# Patient Record
Sex: Male | Born: 1960 | Race: Black or African American | Hispanic: No | Marital: Single | Smoking: Current every day smoker
Health system: Southern US, Community
[De-identification: ages and names within clinical notes are randomized; demographics above are authoritative.]

## PROBLEM LIST (undated history)

## (undated) DIAGNOSIS — E785 Hyperlipidemia, unspecified: Secondary | ICD-10-CM

## (undated) DIAGNOSIS — I639 Cerebral infarction, unspecified: Secondary | ICD-10-CM

## (undated) DIAGNOSIS — I10 Essential (primary) hypertension: Secondary | ICD-10-CM

## (undated) DIAGNOSIS — E119 Type 2 diabetes mellitus without complications: Secondary | ICD-10-CM

## (undated) HISTORY — PX: TOTAL HIP ARTHROPLASTY: SHX124

---

## 2018-03-17 ENCOUNTER — Emergency Department (HOSPITAL_BASED_OUTPATIENT_CLINIC_OR_DEPARTMENT_OTHER): Payer: Medicaid Other

## 2018-03-17 ENCOUNTER — Other Ambulatory Visit: Payer: Self-pay

## 2018-03-17 ENCOUNTER — Encounter (HOSPITAL_BASED_OUTPATIENT_CLINIC_OR_DEPARTMENT_OTHER): Payer: Self-pay | Admitting: *Deleted

## 2018-03-17 ENCOUNTER — Emergency Department (HOSPITAL_BASED_OUTPATIENT_CLINIC_OR_DEPARTMENT_OTHER)
Admission: EM | Admit: 2018-03-17 | Discharge: 2018-03-17 | Disposition: A | Discharge status: Home / Self Care | Payer: Medicaid Other | Attending: Emergency Medicine | Admitting: Emergency Medicine

## 2018-03-17 DIAGNOSIS — Z7984 Long term (current) use of oral hypoglycemic drugs: Secondary | ICD-10-CM | POA: Diagnosis not present

## 2018-03-17 DIAGNOSIS — I10 Essential (primary) hypertension: Secondary | ICD-10-CM | POA: Diagnosis not present

## 2018-03-17 DIAGNOSIS — E119 Type 2 diabetes mellitus without complications: Secondary | ICD-10-CM | POA: Diagnosis not present

## 2018-03-17 DIAGNOSIS — Z79899 Other long term (current) drug therapy: Secondary | ICD-10-CM | POA: Diagnosis not present

## 2018-03-17 DIAGNOSIS — W06XXXA Fall from bed, initial encounter: Secondary | ICD-10-CM | POA: Diagnosis not present

## 2018-03-17 DIAGNOSIS — R911 Solitary pulmonary nodule: Secondary | ICD-10-CM | POA: Diagnosis not present

## 2018-03-17 DIAGNOSIS — Z8673 Personal history of transient ischemic attack (TIA), and cerebral infarction without residual deficits: Secondary | ICD-10-CM | POA: Diagnosis not present

## 2018-03-17 DIAGNOSIS — F17210 Nicotine dependence, cigarettes, uncomplicated: Secondary | ICD-10-CM | POA: Insufficient documentation

## 2018-03-17 DIAGNOSIS — R319 Hematuria, unspecified: Secondary | ICD-10-CM | POA: Diagnosis not present

## 2018-03-17 DIAGNOSIS — R0789 Other chest pain: Secondary | ICD-10-CM | POA: Diagnosis present

## 2018-03-17 DIAGNOSIS — E871 Hypo-osmolality and hyponatremia: Secondary | ICD-10-CM | POA: Diagnosis not present

## 2018-03-17 DIAGNOSIS — Z96649 Presence of unspecified artificial hip joint: Secondary | ICD-10-CM | POA: Diagnosis not present

## 2018-03-17 DIAGNOSIS — W19XXXA Unspecified fall, initial encounter: Secondary | ICD-10-CM

## 2018-03-17 HISTORY — DX: Cerebral infarction, unspecified: I63.9

## 2018-03-17 HISTORY — DX: Type 2 diabetes mellitus without complications: E11.9

## 2018-03-17 HISTORY — DX: Hyperlipidemia, unspecified: E78.5

## 2018-03-17 HISTORY — DX: Essential (primary) hypertension: I10

## 2018-03-17 LAB — CBC
HCT: 43.5 % (ref 39.0–52.0)
Hemoglobin: 14.2 g/dL (ref 13.0–17.0)
MCH: 30.5 pg (ref 26.0–34.0)
MCHC: 32.6 g/dL (ref 30.0–36.0)
MCV: 93.5 fL (ref 80.0–100.0)
Platelets: 255 10*3/uL (ref 150–400)
RBC: 4.65 MIL/uL (ref 4.22–5.81)
RDW: 12.7 % (ref 11.5–15.5)
WBC: 13.8 10*3/uL — ABNORMAL HIGH (ref 4.0–10.5)
nRBC: 0 % (ref 0.0–0.2)

## 2018-03-17 LAB — BASIC METABOLIC PANEL
ANION GAP: 9 (ref 5–15)
BUN: 20 mg/dL (ref 6–20)
CO2: 19 mmol/L — ABNORMAL LOW (ref 22–32)
Calcium: 8.7 mg/dL — ABNORMAL LOW (ref 8.9–10.3)
Chloride: 100 mmol/L (ref 98–111)
Creatinine, Ser: 1.1 mg/dL (ref 0.61–1.24)
GFR calc Af Amer: >60 mL/min (ref >60)
GFR calc non Af Amer: >60 mL/min (ref >60)
Glucose, Bld: 115 mg/dL — ABNORMAL HIGH (ref 70–99)
Potassium: 4.1 mmol/L (ref 3.5–5.1)
SODIUM: 128 mmol/L — AB (ref 135–145)

## 2018-03-17 MED ORDER — METHOCARBAMOL 500 MG PO TABS: 500.0000 mg | ORAL_TABLET | Freq: Three times a day (TID) | ORAL | 0 refills | Status: AC | PRN

## 2018-03-17 MED ORDER — NAPROXEN 500 MG PO TABS: 500.0000 mg | ORAL_TABLET | Freq: Two times a day (BID) | ORAL | 0 refills | Status: AC

## 2018-03-17 MED ORDER — IOPAMIDOL (ISOVUE-300) INJECTION 61%
100.0000 mL | Freq: Once | INTRAVENOUS | Status: AC | PRN
  Administered 2018-03-17: 100 mL via INTRAVENOUS

## 2018-03-17 MED ORDER — SODIUM CHLORIDE 0.9 % IV BOLUS
1000.0000 mL | Freq: Once | INTRAVENOUS | Status: AC
  Administered 2018-03-17: 1000 mL via INTRAVENOUS

## 2018-03-17 NOTE — ED Provider Notes (Signed)
[facility] EMERGENCY DEPARTMENT Provider Note   CSN: [id number] Arrival date & time: 03/17/18  1332    History   Chief Complaint Chief Complaint  Patient presents with  . Fall    HPI Jared Ramos is a 58 y.o. male with a hx of HTN, hyperlipidemia, DM, prior stroke,and tobacco abuse who presents to the ED s/p mechanical fall 3 days prior with complaints of left-sided rib pain and abdominal pain.  Patient states that he rolled over in bed and accidentally fell out of bed and struck the posterior left ribs on a trash can on the ground.  Denies head injury or loss of consciousness.  He has been ambulatory since the fall.  He states he is having pain isolated to the left posterior/lateral ribs and the left side of the abdomen.  He has noted associated bruising to the left flank.  Current discomfort is moderate in severity, worse with movement and a deep breath, no alleviating factors.  Denies dyspnea, hemoptysis, melena, hematochezia, or hematuria.  Denies numbness, tingling, weakness, or incontinence.  Patient is not currently taking any blood thinners.     HPI  Past Medical History:  Diagnosis Date  . Diabetes mellitus without complication (HCC)   . Hyperlipidemia   . Hypertension   . Stroke [facility](HCC)     There are no active problems to display for this patient.   Past Surgical History:  Procedure Laterality Date  . TOTAL HIP ARTHROPLASTY          Home Medications    Prior to Admission medications   Medication Sig Start Date End Date Taking? Authorizing Provider  buPROPion (WELLBUTRIN SR) 150 MG 12 hr tablet Take 1 tab daily x 3 days then BID. Start 1 to 2 weeks before quit date. 11/24/17  Yes [provider]  esomeprazole (NEXIUM) 40 MG capsule TAKE 1 CAPSULE BY MOUTH EVERY DAY FOR ACID REFLUX 01/26/18  Yes [provider]  gabapentin (NEURONTIN) 300 MG capsule TAKE 1 CAPSULE BY MOUTH THREE TIMES A DAY 02/17/18  Yes [provider]    glimepiride (AMARYL) 2 MG tablet TAKE 1 TABLET BY MOUTH EVERY DAY 12/23/17  Yes [provider]  lisinopril-hydrochlorothiazide (PRINZIDE,ZESTORETIC) 20-12.5 MG tablet TAKE 1 TABLET BY MOUTH EVERY DAY 06/09/17  Yes [provider]  metFORMIN (GLUCOPHAGE) 1000 MG tablet Take by mouth. 10/12/17  Yes [provider]  rosuvastatin (CRESTOR) 40 MG tablet Take by mouth. 12/23/17  Yes [provider]  sitaGLIPtin (JANUVIA) 100 MG tablet Take by mouth. 02/03/18  Yes [provider]  zolpidem (AMBIEN) 10 MG tablet Take by mouth. 01/26/18  Yes [provider]    Family History History reviewed. No pertinent family history.  Social History Social History   Tobacco Use  . Smoking status: Current Every Day Smoker    Packs/day: 1.00    Types: Cigarettes  . Smokeless tobacco: Never Used  Substance Use Topics  . Alcohol use: Yes    Comment:  2 bottles wine   . Drug use: Not Currently     Allergies   Patient has no known allergies.   Review of Systems Review of Systems  Constitutional: Negative for chills and fever.  Respiratory: Negative for cough and shortness of breath.   Gastrointestinal: Positive for abdominal pain. Negative for blood in stool, diarrhea, nausea and vomiting.  Genitourinary: Negative for hematuria.  Musculoskeletal: Positive for back pain (L sided ribs/back).  All other systems reviewed and are negative.  Physical Exam Updated Vital Signs BP 103/76 (BP Location: Right Arm)   Pulse 99   Temp 98.3 F (36.8 C) (Oral)   Resp 16   Ht 5\' 9"  (1.753 m)   Wt 102.5 kg   SpO2 99%   BMI 33.37 kg/m   Physical Exam Vitals signs and nursing note reviewed.  Constitutional:      General: He is not in acute distress.    Appearance: He is well-developed. He is not toxic-appearing.  HENT:     Head: Normocephalic and atraumatic.     Comments: No raccoon eyes or battle sign.    Ears:     Comments: No  hemotympanum. Eyes:     General:        Right eye: No discharge.        Left eye: No discharge.     Conjunctiva/sclera: Conjunctivae normal.  Neck:     Musculoskeletal: Neck supple.  Cardiovascular:     Rate and Rhythm: Normal rate and regular rhythm.  Pulmonary:     Effort: Pulmonary effort is normal. No respiratory distress.     Breath sounds: Normal breath sounds. No wheezing, rhonchi or rales.  Abdominal:     General: There is no distension.     Palpations: Abdomen is soft.     Tenderness: There is abdominal tenderness (Left upper and left lower quadrant.). There is no guarding or rebound.  Musculoskeletal:     Comments: Patient has ecchymosis noted to the left flank/lower back.  He has no midline tenderness to palpation or palpable step-off.  He does have left thoracic and lumbar paraspinal muscle tenderness to palpation. Upper/lower extremities are nontender.  Skin:    General: Skin is warm and dry.     Findings: No rash.  Neurological:     Mental Status: He is alert.     Comments: Clear speech.   Psychiatric:        Behavior: Behavior normal.    ED Treatments / Results  Labs (all labs ordered are listed, but only abnormal results are displayed) Labs Reviewed  CBC - Abnormal; Notable for the following components:      Result Value   WBC 13.8 (*)    All other components within normal limits  BASIC METABOLIC PANEL - Abnormal; Notable for the following components:   Sodium 128 (*)    CO2 19 (*)    Glucose, Bld 115 (*)    Calcium 8.7 (*)    All other components within normal limits    EKG None  Radiology Dg Ribs Unilateral W/chest Left  Result Date: 03/17/2018 CLINICAL DATA:  Larey Seat from bed 3 days ago. EXAM: LEFT RIBS AND CHEST - 3+ VIEW COMPARISON:  CT chest 12/09/2017. FINDINGS: No acute fracture or other bone lesions are seen involving the ribs. There is no evidence of pneumothorax or pleural effusion. Heart size and mediastinal contours are within normal  limits. Subtle healing fractures of the LEFT fifth and sixth ribs noted on recent CT are not well visualized on today's exam. No edema or consolidation. RIGHT midlung zone scarring versus subsegmental atelectasis. IMPRESSION: No acute rib fracture is evident.  No pneumothorax or effusion. Electronically Signed   By: Elsie Stain M.D.   On: 03/17/2018 15:18   Ct Abdomen Pelvis W Contrast  Result Date: 03/17/2018 CLINICAL DATA:  Fall, hematuria EXAM: CT ABDOMEN AND PELVIS WITH CONTRAST TECHNIQUE: Multidetector CT imaging of the abdomen and pelvis was performed using the standard protocol following bolus administration  of intravenous contrast. CONTRAST:  ISOVUE-300 IOPAMIDOL (ISOVUE-300) INJECTION 61% COMPARISON:  CT chest, 12/09/2017 FINDINGS: Lower chest: No acute abnormality. 7 mm subpleural pulmonary nodule of the left lung base (series 3, image 6). Hepatobiliary: No focal liver abnormality is seen. No gallstones, gallbladder wall thickening, or biliary dilatation. Pancreas: Unremarkable. No pancreatic ductal dilatation or surrounding inflammatory changes. Spleen: Normal in size without focal abnormality. Adrenals/Urinary Tract: Adrenal glands are unremarkable. Kidneys are normal, without renal calculi, focal lesion, or hydronephrosis. Bladder is unremarkable. Stomach/Bowel: Stomach is within normal limits. Appendix appears normal. No evidence of bowel wall thickening, distention, or inflammatory changes. Vascular/Lymphatic: Calcific atherosclerosis. No enlarged abdominal or pelvic lymph nodes. Reproductive: No mass or other abnormality. Other: No abdominal wall hernia or abnormality. No abdominopelvic ascites. Musculoskeletal: No acute or significant osseous findings. Status post left hip total arthroplasty. IMPRESSION: 1. No CT evidence of acute traumatic injury to the abdomen or pelvis. No findings to explain hematuria. 2. Incidental note of a 7 mm subpleural pulmonary nodule of the left lung base  (series 3, image 6). This is stable in comparison to prior lung cancer screening CT dated 12/09/2017. Given interval stability, it is appropriate to return to annual CT lung cancer screening. At minimum recommend additional follow-up CT examination in 12-18 months to ensure long-term stability. Electronically Signed   By: Lauralyn Primes M.D.   On: 03/17/2018 15:23    Procedures Procedures (including critical care time)  Medications Ordered in ED Medications  iopamidol (ISOVUE-300) 61 % injection 100 mL (100 mLs Intravenous Contrast Given 03/17/18 1459)  sodium chloride 0.9 % bolus 1,000 mL (1,000 mLs Intravenous New Bag/Given 03/17/18 1546)     Initial Impression / Assessment and Plan / ED Course  I have reviewed the triage vital signs and the nursing notes.  Pertinent labs & imaging results that were available during my care of the patient were reviewed by me and considered in my medical decision making (see chart for details).   Patient presents to the emergency department status post mechanical fall 3 days prior with left sided rib/abdominal injury.  Patient nontoxic-appearing, no apparent distress, vitals WNL.  On exam patient has some ecchymosis over the left flank/lower back, under palpation along the left posterior and lateral lower ribs.  He is also tender in the left upper and left lower quadrant of the abdomen without peritoneal signs.  Will further evaluate with basic labs, rib x-ray, and CT abdomen pelvis with contrast. No evidence of serious head/neck injury.   Work-up reviewed: CBC: Nonspecific leukocytosis at 13.8.  No anemia.  BMP: Patient is notably hyponatremic to 128, prior labs on record showed that sodium tends to run around 135, I re-discussed with patient- adamantly denies lightheadedness, dizziness, ataxia, or sxs other than his discomfort, he appears a bit dry on exam, will give 1 L of fluids, we will have the patient return to the ER 02/21 @ 8AM for a repeat sodium level  to ensure improvement and close follow-up. Imaging: No fracture or dislocation.  No acute traumatic injury to the abdomen.  CT report narrative mentions hematuria, patient denied this to me on multiple occassions, no urinary sxs in general, CT was obtained due to trauma & tenderness. Incidental subpleural pulmonary nodule noted in the left lung base- stable in comparison to prior CT for lung cancer screening- will require continued monitoring per radiology. In setting of tobacco use & hyponatremia feel this is essential, re-assuring that it appears stable at this time.  Possibly muscular etiology of discomfort. Will tx w/ robaxin & naproxen, discussed no driving or operative heavy machinery with this medicine. ER follow up in 2 days for repeat sodium. General PCP follow up as well. I discussed results, treatment plan, need for follow-up, and return precautions with the patient & his friend @ bedside. Provided opportunity for questions, patient confirmed understanding and is in agreement with plan.     Findings and plan of care discussed with supervising physician Dr. Patria Mane who is in agreement.   Final Clinical Impressions(s) / ED Diagnoses   Final diagnoses:  Fall, initial encounter  Hyponatremia  Lung nodule    ED Discharge Orders         Ordered    naproxen (NAPROSYN) 500 MG tablet  2 times daily     03/17/18 1713    methocarbamol (ROBAXIN) 500 MG tablet  Every 8 hours PRN     03/17/18 [address], Pleas Koch, PA-C 03/17/18 1715    Azalia Bilis, MD 03/18/18 [phone]

## 2018-03-17 NOTE — ED Notes (Signed)
Pt verbalizes understanding of d/c instructions and denies any further needs at this time. 

## 2018-03-17 NOTE — Discharge Instructions (Signed)
You are seen in the emergency department today following a fall.  Your x-ray and CT scan did not show any acute traumatic injuries.  We suspect your symptoms may to be due to bruising and muscular issues.  We are sending you home with the following medicines to help with this:  - Naproxen is a nonsteroidal anti-inflammatory medication that will help with pain and swelling. Be sure to take this medication as prescribed with food, 1 pill every 12 hours,  It should be taken with food, as it can cause stomach upset, and more seriously, stomach bleeding. Do not take other nonsteroidal anti-inflammatory medications with this such as Advil, Motrin, Aleve, Mobic, Goodie Powder, or Motrin.    - Robaxin is the muscle relaxer I have prescribed, this is meant to help with muscle tightness. Be aware that this medication may make you drowsy therefore the first time you take this it should be at a time you are in an environment where you can rest. Do not drive or operate heavy machinery when taking this medication. Do not drink alcohol or take other sedating medications with this medicine such as narcotics or benzodiazepines.   You make take Tylenol per over the counter dosing with these medications.   We have prescribed you new medication(s) today. Discuss the medications prescribed today with your pharmacist as they can have adverse effects and interactions with your other medicines including over the counter and prescribed medications. Seek medical evaluation if you start to experience new or abnormal symptoms after taking one of these medicines, seek care immediately if you start to experience difficulty breathing, feeling of your throat closing, facial swelling, or rash as these could be indications of a more serious allergic reaction   Your CT scan did show that you have a 7 mm subpleural pulmonary nodule at the base of your left lung.  This was seen on prior CT scan of last year.  It is essential that this is  followed up by your primary care provider.  You will need a repeat CT scan within 12 months to assess for any changes. Please discuss this with your primary care provider.   Your lab work showed that your sodium is low at 128, we gave you some fluids in the emergency department to help with this,return to the emergency department this Friday 02/21 at 8:00 AM for a repeat sodium level to ensure that this is improving.  We would also you to follow-up with your primary care provider within the next 2 to 3 days as well for an overall recheck.  Please return to the ER sooner should you experience new or worsening symptoms or any other concerns.

## 2018-03-17 NOTE — ED Triage Notes (Signed)
Pt c/o fall from bed x 3 days ago ,c/o left rib pain

## 2018-03-17 NOTE — ED Notes (Signed)
Pt ambulatory to restroom without difficulty.

## 2020-01-17 IMAGING — CT CT ABD-PELV W/ CM
2 of 5 series · 16 of 46 positions shown, 18 images · IV contrast (iopamidol)
Comparison: CT chest, 12/09/2017

CLINICAL DATA: Fall, hematuria

EXAM:
CT ABDOMEN AND PELVIS WITH CONTRAST
TECHNIQUE: Multidetector CT imaging of the abdomen and pelvis was performed
using the standard protocol following bolus administration of
intravenous contrast.
CONTRAST:  100mL MQH76C-ESS IOPAMIDOL (MQH76C-ESS) INJECTION 61%

[Series 2: axial st · axial · 0.98mm/px · z∈[-568,-28]mm · 13 of 122 slices shown, 15 images]
[im 7/122  soft-tissue]
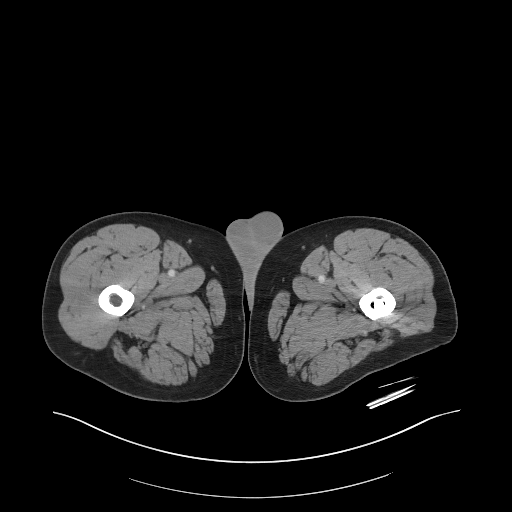
[im 7/122  bone]
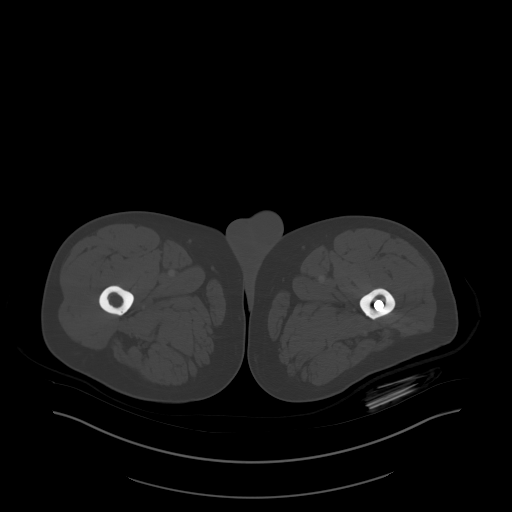
[im 19/122  soft-tissue]
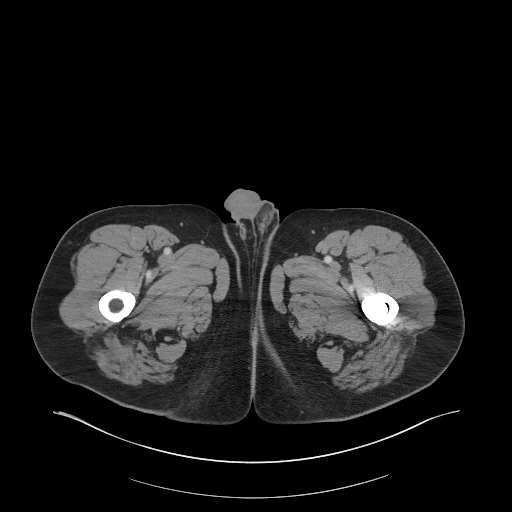
[im 25/122  soft-tissue]
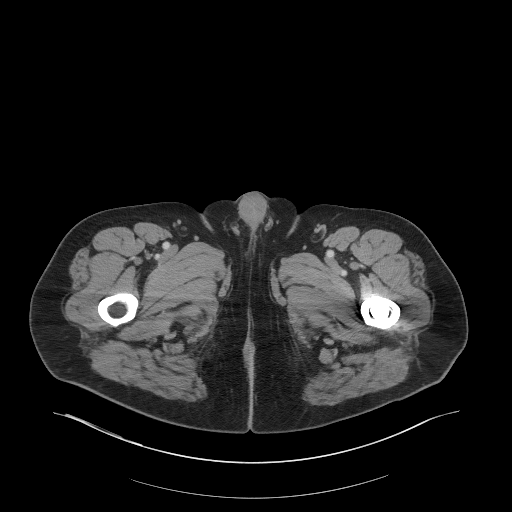
[im 37/122  soft-tissue]
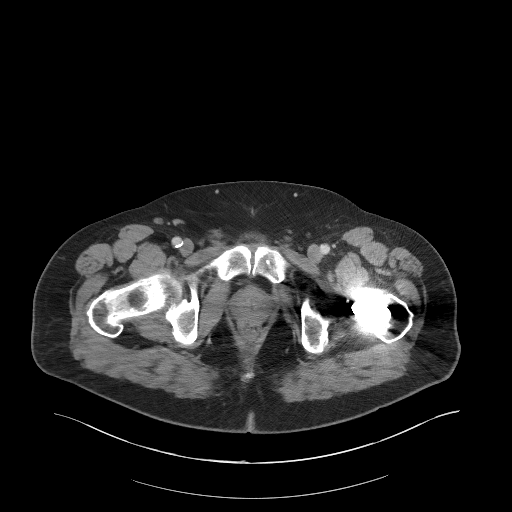
[im 43/122  soft-tissue]
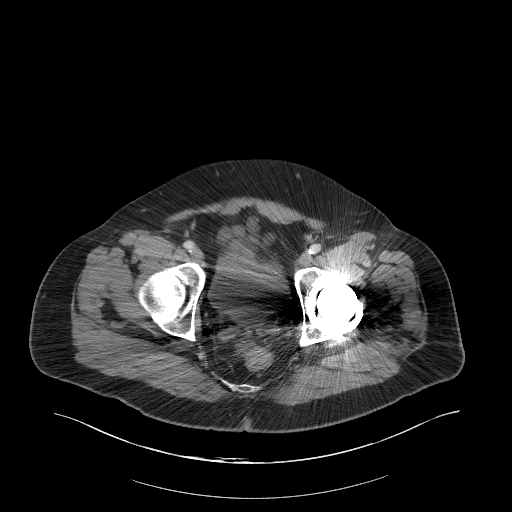
[im 55/122  soft-tissue]
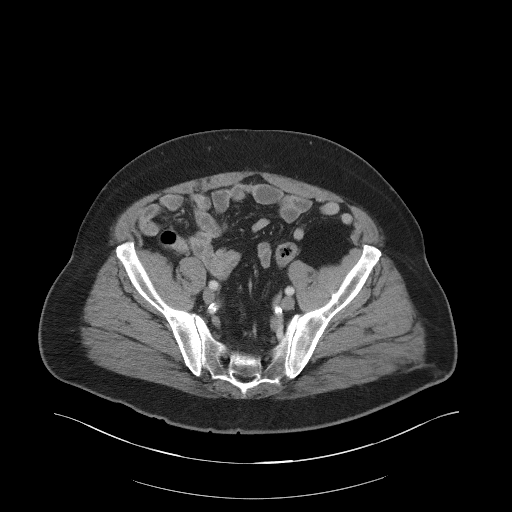
[im 61/122  soft-tissue]
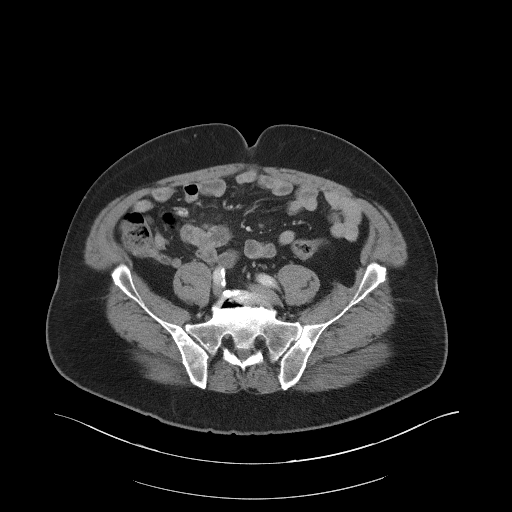
[im 67/122  soft-tissue]
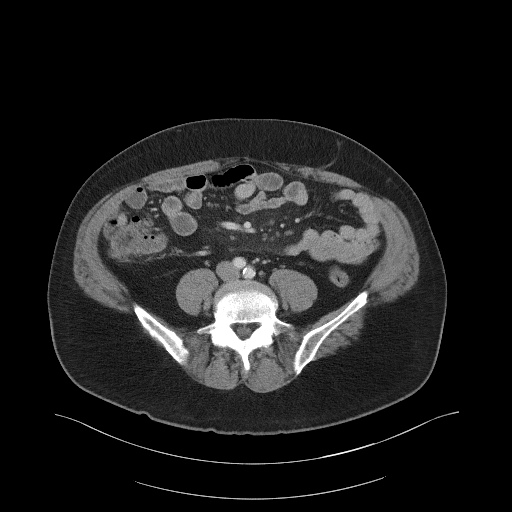
[im 79/122  soft-tissue]
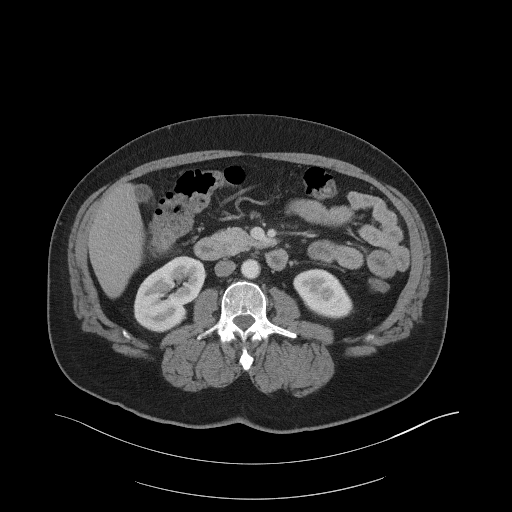
[im 79/122  bone]
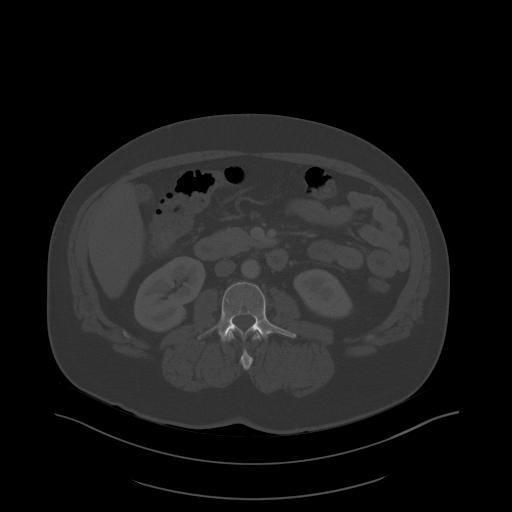
[im 85/122  soft-tissue]
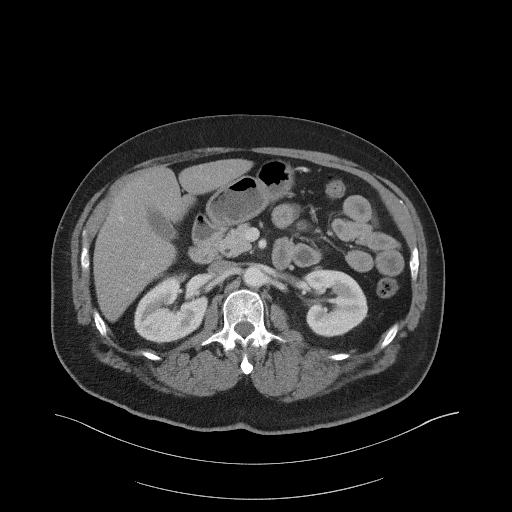
[im 97/122  soft-tissue]
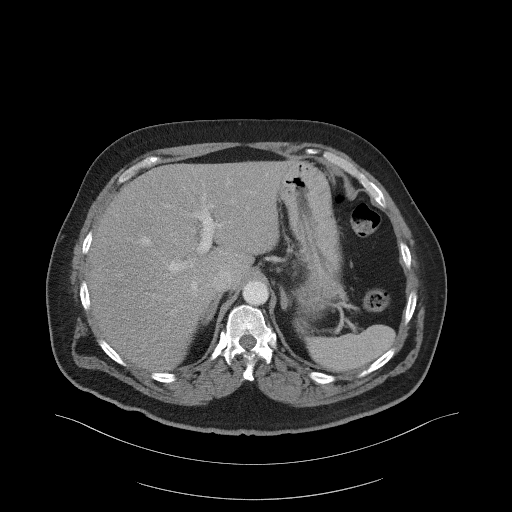
[im 103/122  soft-tissue]
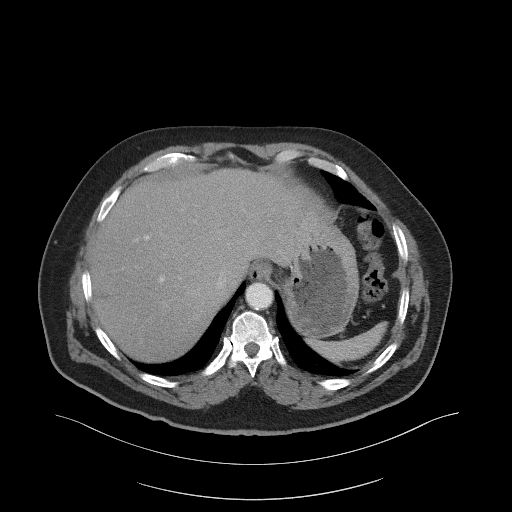
[im 115/122  soft-tissue]
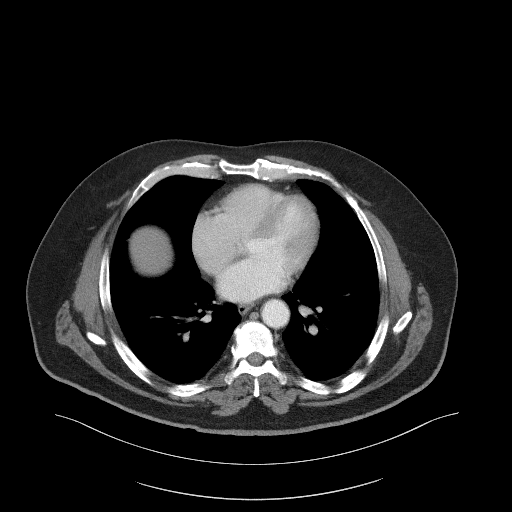

[Series 5: coronal st · coronal · 0.92mm/px · 3 of 104 slices shown]
[im 35/104  soft-tissue]
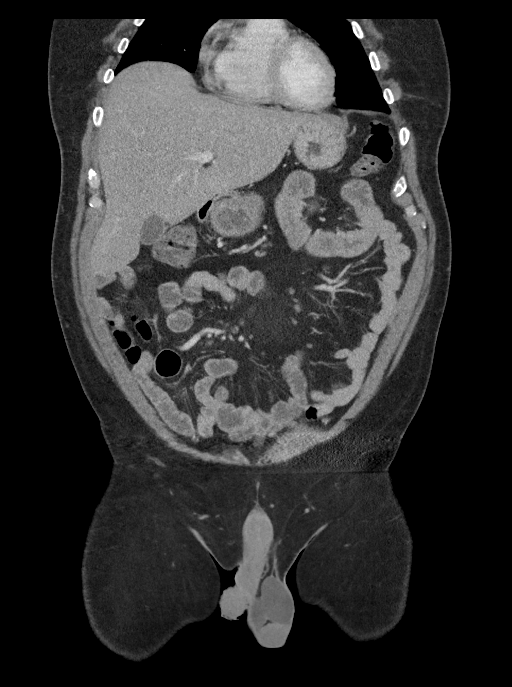
[im 46/104  soft-tissue]
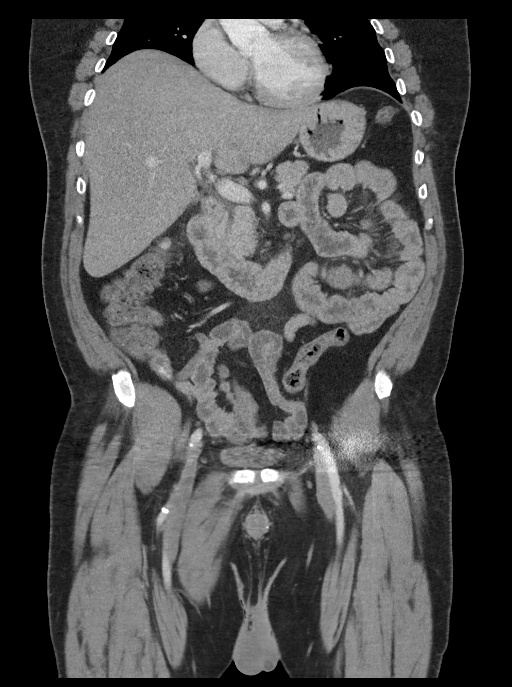
[im 58/104  soft-tissue]
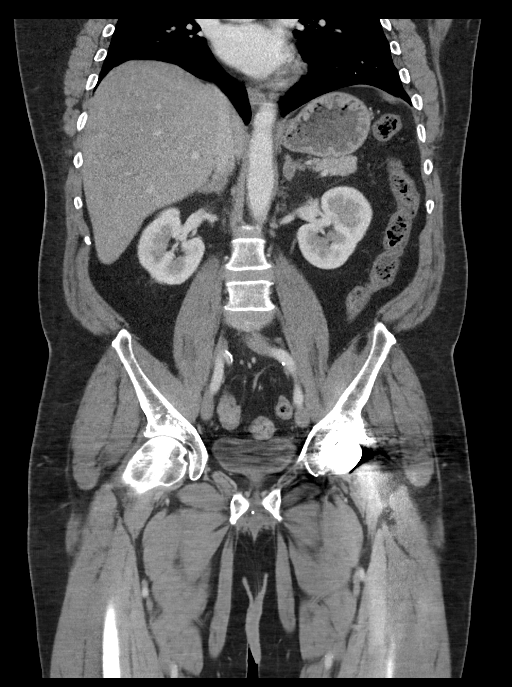

[16 of 46 positions shown; findings below may reference images not displayed]

FINDINGS: Lower chest: No acute abnormality. 7 mm subpleural pulmonary nodule
of the left lung base (series 3, image 6).

Hepatobiliary: No focal liver abnormality is seen. No gallstones,
gallbladder wall thickening, or biliary dilatation.

Pancreas: Unremarkable. No pancreatic ductal dilatation or
surrounding inflammatory changes.

Spleen: Normal in size without focal abnormality.

Adrenals/Urinary Tract: Adrenal glands are unremarkable. Kidneys are
normal, without renal calculi, focal lesion, or hydronephrosis.
Bladder is unremarkable.

Stomach/Bowel: Stomach is within normal limits. Appendix appears
normal. No evidence of bowel wall thickening, distention, or
inflammatory changes.

Vascular/Lymphatic: Calcific atherosclerosis. No enlarged abdominal
or pelvic lymph nodes.

Reproductive: No mass or other abnormality.

Other: No abdominal wall hernia or abnormality. No abdominopelvic
ascites.

Musculoskeletal: No acute or significant osseous findings. Status
post left hip total arthroplasty.
IMPRESSION: 1. No CT evidence of acute traumatic injury to the abdomen or
pelvis. No findings to explain hematuria.

2. Incidental note of a 7 mm subpleural pulmonary nodule of the left
lung base (series 3, image 6). This is stable in comparison to prior
lung cancer screening CT dated 12/09/2017. Given interval stability,
it is appropriate to return to annual CT lung cancer screening. At
minimum recommend additional follow-up CT examination in 12-18
months to ensure long-term stability.

## 2020-01-17 IMAGING — DX DG RIBS W/ CHEST 3+V*L*
3 series · 3 of 3 positions shown · non-contrast
Comparison: CT chest 12/09/2017.

CLINICAL DATA: Fell from bed 3 days ago.

EXAM:
LEFT RIBS AND CHEST - 3+ VIEW

[chest pa]
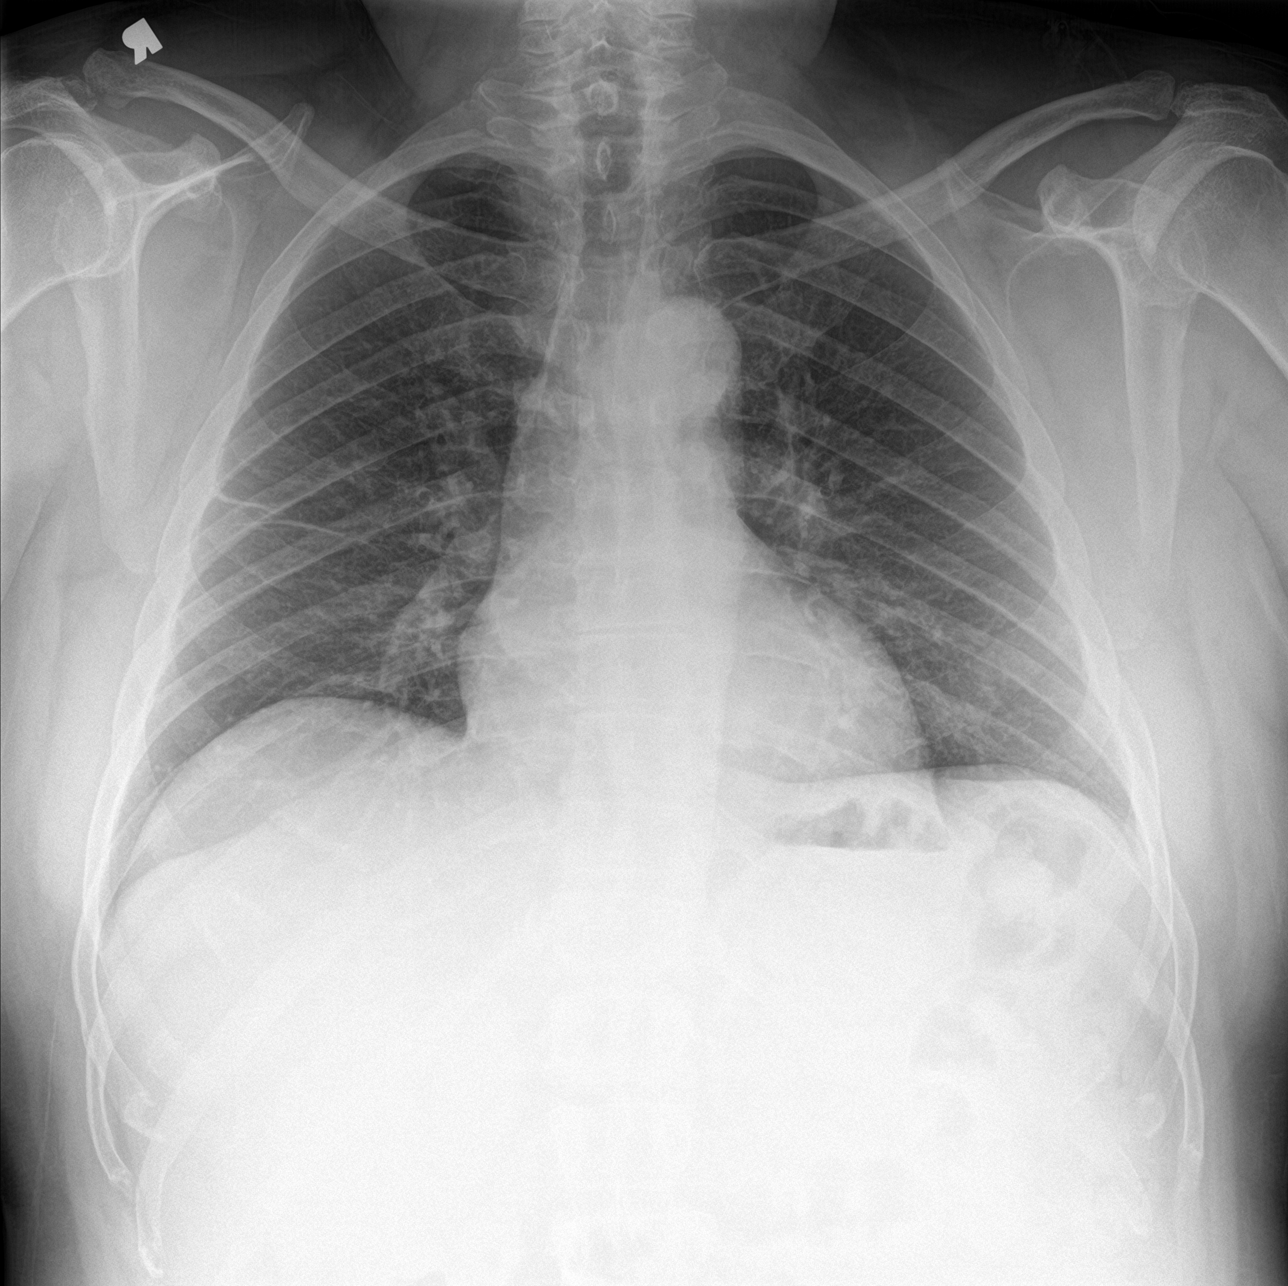

[rib ap]
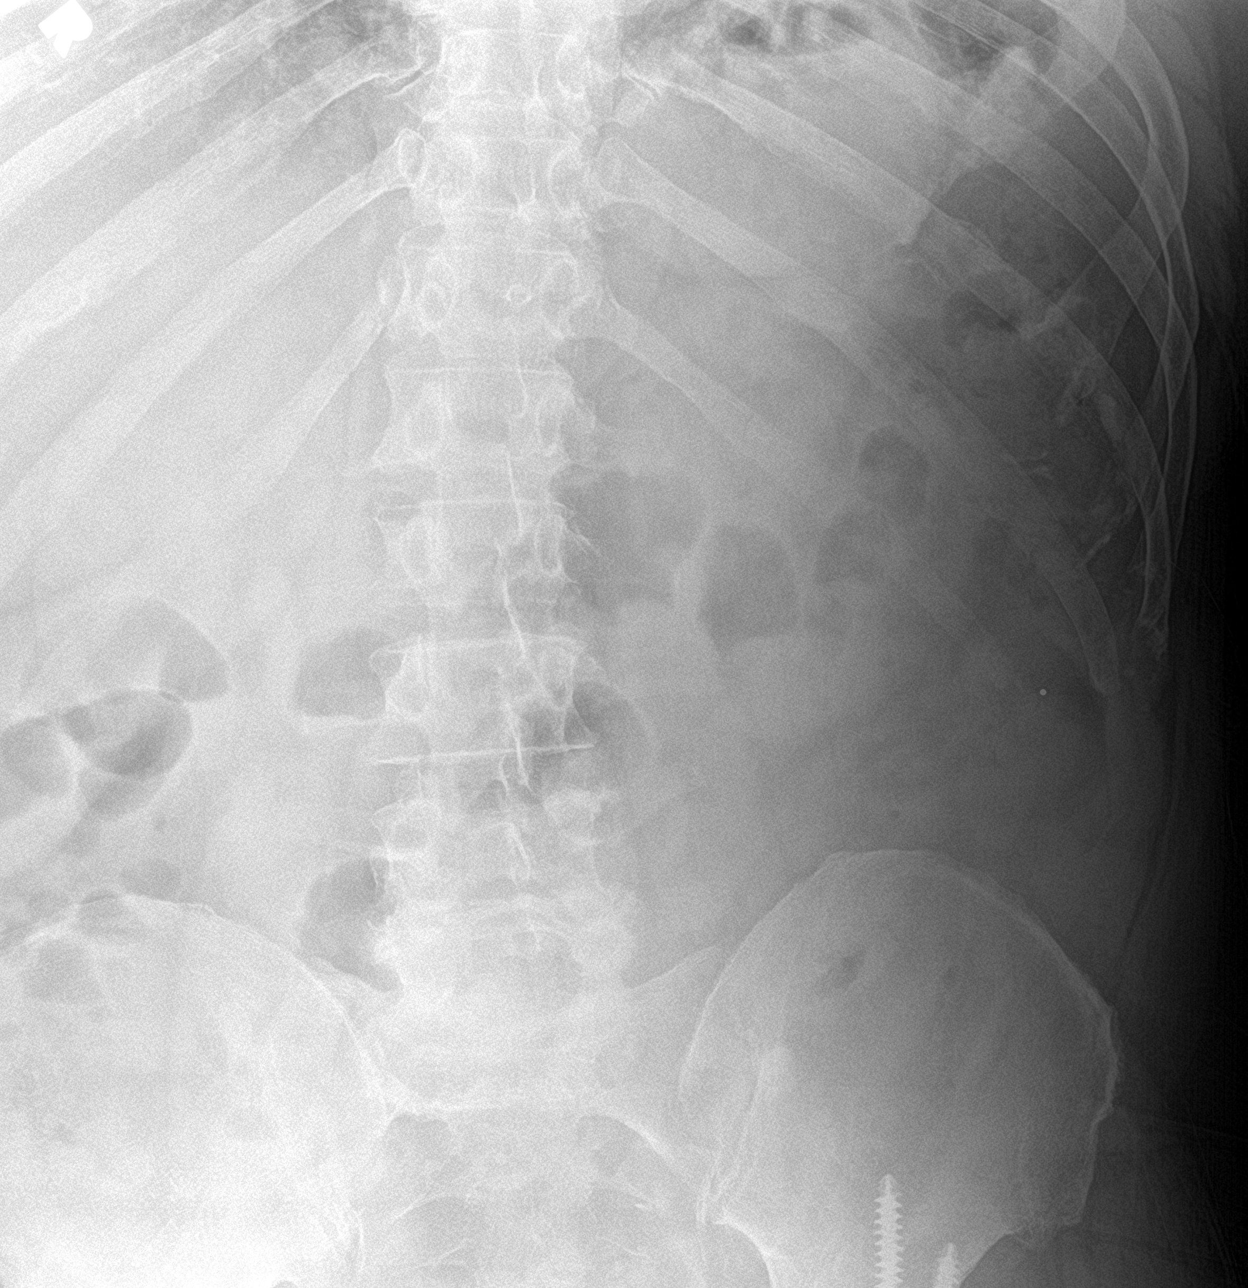

[rib ap obl]
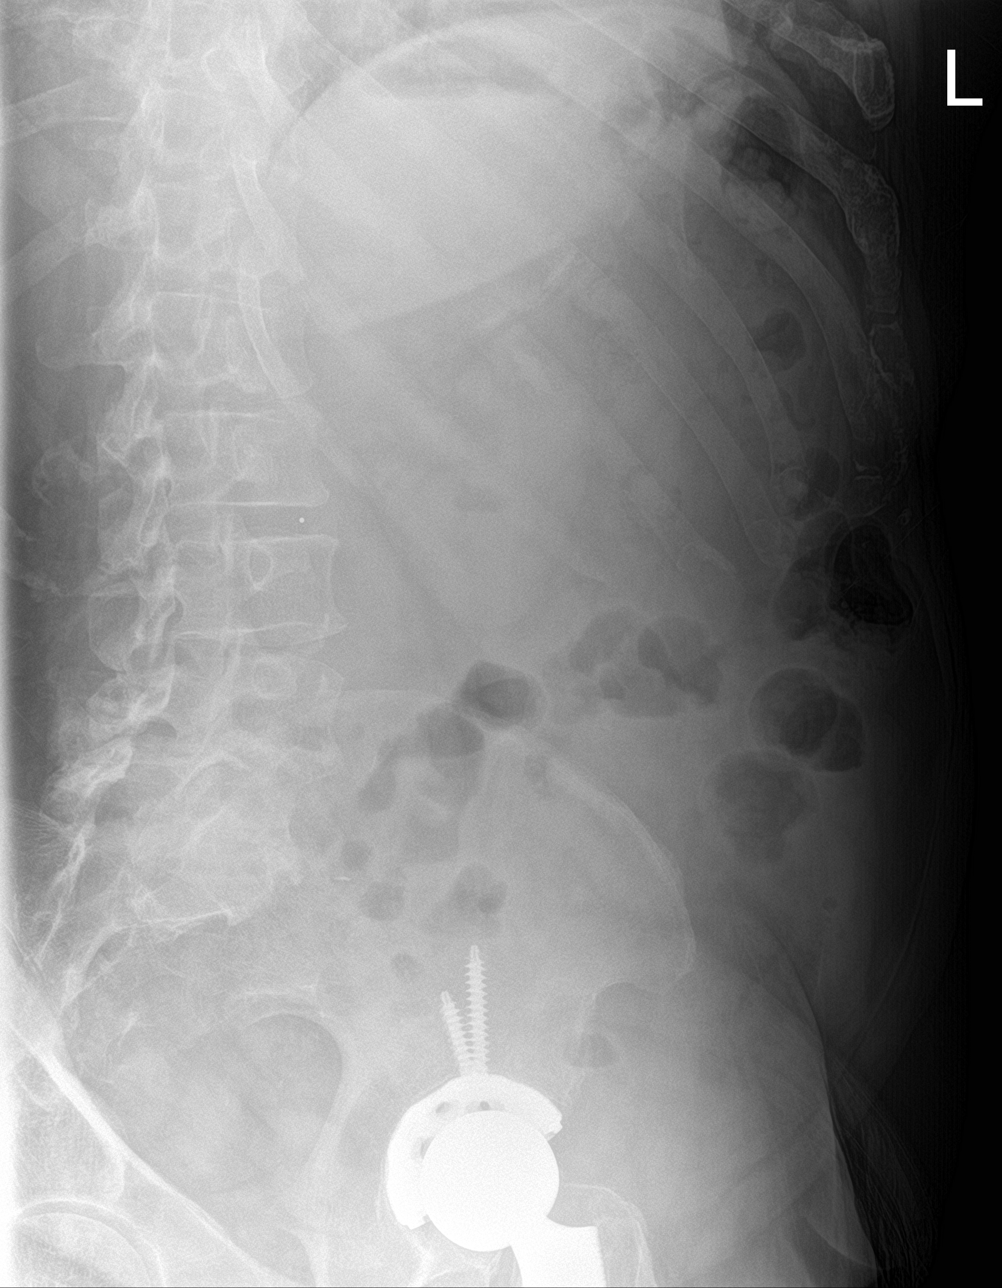

[3 of 3 positions shown; findings below may reference images not displayed]

FINDINGS: No acute fracture or other bone lesions are seen involving the ribs.
There is no evidence of pneumothorax or pleural effusion. Heart size
and mediastinal contours are within normal limits. Subtle healing
fractures of the LEFT fifth and sixth ribs noted on recent CT are
not well visualized on today's exam. No edema or consolidation.
RIGHT midlung zone scarring versus subsegmental atelectasis.
IMPRESSION: No acute rib fracture is evident.  No pneumothorax or effusion.

## 2020-01-27 ENCOUNTER — Encounter (HOSPITAL_BASED_OUTPATIENT_CLINIC_OR_DEPARTMENT_OTHER): Payer: Self-pay | Admitting: *Deleted

## 2020-01-27 ENCOUNTER — Other Ambulatory Visit: Payer: Self-pay

## 2020-01-27 ENCOUNTER — Emergency Department (HOSPITAL_BASED_OUTPATIENT_CLINIC_OR_DEPARTMENT_OTHER)
Admission: EM | Admit: 2020-01-27 | Discharge: 2020-01-27 | Disposition: A | Discharge status: Home / Self Care | Payer: Medicaid Other | Attending: Emergency Medicine | Admitting: Emergency Medicine

## 2020-01-27 DIAGNOSIS — L0232 Furuncle of buttock: Secondary | ICD-10-CM | POA: Insufficient documentation

## 2020-01-27 DIAGNOSIS — R222 Localized swelling, mass and lump, trunk: Secondary | ICD-10-CM | POA: Diagnosis present

## 2020-01-27 DIAGNOSIS — Z5321 Procedure and treatment not carried out due to patient leaving prior to being seen by health care provider: Secondary | ICD-10-CM | POA: Diagnosis not present

## 2020-01-27 NOTE — ED Triage Notes (Signed)
Pt reports "boil" to between his buttocks x 5 days, hx of same. Denies fevers, cough or any other c/o.
